# Patient Record
Sex: Male | Born: 1985 | Race: White | Hispanic: No | Marital: Married | Smoking: Current every day smoker
Health system: Southern US, Community
[De-identification: ages and names within clinical notes are randomized; demographics above are authoritative.]

## PROBLEM LIST (undated history)

## (undated) DIAGNOSIS — J45909 Unspecified asthma, uncomplicated: Secondary | ICD-10-CM

---

## 2009-10-03 ENCOUNTER — Encounter: Payer: Self-pay | Admitting: Internal Medicine

## 2009-10-05 ENCOUNTER — Encounter (INDEPENDENT_AMBULATORY_CARE_PROVIDER_SITE_OTHER): Payer: Self-pay | Admitting: *Deleted

## 2009-11-11 ENCOUNTER — Ambulatory Visit: Payer: Self-pay | Admitting: Internal Medicine

## 2009-11-11 DIAGNOSIS — R197 Diarrhea, unspecified: Secondary | ICD-10-CM | POA: Insufficient documentation

## 2009-11-11 DIAGNOSIS — K625 Hemorrhage of anus and rectum: Secondary | ICD-10-CM

## 2009-11-11 DIAGNOSIS — K219 Gastro-esophageal reflux disease without esophagitis: Secondary | ICD-10-CM | POA: Insufficient documentation

## 2009-11-11 LAB — CONVERTED CEMR LAB
ALT: 50 units/L (ref 0–53)
Basophils Absolute: 0 10*3/uL (ref 0.0–0.1)
CO2: 27 meq/L (ref 19–32)
Creatinine, Ser: 1 mg/dL (ref 0.4–1.5)
Eosinophils Absolute: 0.6 10*3/uL (ref 0.0–0.7)
GFR calc non Af Amer: 95.74 mL/min (ref >60)
HCT: 44.1 % (ref 39.0–52.0)
Hemoglobin: 15.4 g/dL (ref 13.0–17.0)
Lymphs Abs: 2.9 10*3/uL (ref 0.7–4.0)
MCHC: 34.9 g/dL (ref 30.0–36.0)
Monocytes Relative: 5.4 % (ref 3.0–12.0)
Neutro Abs: 8 10*3/uL — ABNORMAL HIGH (ref 1.4–7.7)
RDW: 12.8 % (ref 11.5–14.6)
Total Bilirubin: 0.6 mg/dL (ref 0.3–1.2)

## 2009-11-17 ENCOUNTER — Ambulatory Visit: Payer: Self-pay | Admitting: Internal Medicine

## 2009-11-19 ENCOUNTER — Telehealth: Payer: Self-pay | Admitting: Internal Medicine

## 2010-01-01 ENCOUNTER — Ambulatory Visit: Payer: Self-pay | Admitting: Internal Medicine

## 2010-02-16 NOTE — Procedures (Signed)
Summary: Colonoscopy  Patient: Cory Gregory Note: All result statuses are Final unless otherwise noted.  Tests: (1) Colonoscopy (COL)   COL Colonoscopy           DONE     [facility]     [address]     [place], [place]  [address]           COLONOSCOPY PROCEDURE REPORT           PATIENT:  Cory Gregory, Cory Gregory  MR#:  [id number]     BIRTHDATE:  [birth year], 24 yrs. old  GENDER:  male     ENDOSCOPIST:  Iva Boop, MD, [facility]     REF. BY:          Gillis Ends, MD     PROCEDURE DATE:  11/17/2009     PROCEDURE:  Colonoscopy with biopsy     ASA CLASS:  Class I     INDICATIONS:  unexplained diarrhea, rectal bleeding     MEDICATIONS:   Fentanyl 100 mcg IV, Versed 8 mg IV           DESCRIPTION OF PROCEDURE:   After the risks benefits and     alternatives of the procedure were thoroughly explained, informed     consent was obtained.  Digital rectal exam was performed and     revealed no abnormalities and normal prostate.   The LB CF-[id number]     [id number] endoscope was introduced through the anus and advanced to     the terminal ileum which was intubated for a short distance,     without limitations.  The quality of the prep was excellent, using     MoviPrep.  The instrument was then slowly withdrawn as the colon     was fully examined.     <<PROCEDUREIMAGES>>           FINDINGS:  Abnormal appearing mucosa terminal ileum Patchy nodular     changes with red spots. ? inflammation - Crohn's or ? lymphoid     hyperplasia Multiple biopsies were obtained and sent to pathology.     This was otherwise a normal examination of the colon. throughout     the colon. Random biopsies were obtained and sent to pathology.     Retroflexed views in the rectum revealed internal and external     hemorrhoids and hypertrophied anal papillae.    The scope was then     withdrawn from the patient and the procedure completed.           COMPLICATIONS:  None     ENDOSCOPIC IMPRESSION:     1) Abnormal mucosa in  the terminal ileum - nodular changes ?     Crohn's vs. lymphoid hyperplasia. He is not on NSAIDS.     2) Otherwise normal examination throughout the colon - random     biopsies taken to look for colitis.     3) Internal and external hemorrhoids     4) Hypertrophied anal papillae     RECOMMENDATIONS:     1) Await biopsy results     2) He may use loperamide 2 mg (Imodium AD) up to 4 a day to help     diarrhea, if needed/     3) Will call with results and plans.           REPEAT EXAM:  In for Colonoscopy, pending biopsy results.           Maryjean Morn.  Leone Payor, MD, [facility]           CC:  The Patient     Gillis Ends, MD [facility])           n.     Rosalie Doctor:   Iva Boop at 11/17/2009 12:25 PM           Alden Benjamin, [id number]  Note: An exclamation mark (!) indicates a result that was not dispersed into the flowsheet. Document Creation Date: 11/17/2009 12:27 PM _______________________________________________________________________  (1) Order result status: Final Collection or observation date-time: 11/17/2009 12:17 Requested date-time:  Receipt date-time:  Reported date-time:  Referring Physician:   Ordering Physician: Stan Head [phone]) Specimen Source:  Source: Launa Grill Order Number: [id number] Lab site:   Appended Document: Colonoscopy   Colonoscopy  Procedure date:  11/17/2009  Findings:          1) Abnormal mucosa in the terminal ileum - nodular changes ?     Crohn's vs. lymphoid hyperplasia. He is not on NSAIDS. BENIGN MUCOSA, NO INFLAMMATION     2) Otherwise normal examination throughout the colon - random     biopsies taken to look for colitis. BENIGN, NO INFLAMMATION     3) Internal and external hemorrhoids     4) Hypertrophied anal papillae

## 2010-02-16 NOTE — Progress Notes (Signed)
Summary: colonoscopy results:IBS and hemorrhoids  Phone Note Outgoing Call   Summary of Call: Let him know biopsies do not show Crohn's or other colitis and that this seems like IBS - spasms and rapid movement of stools Hemorrhoids cause of bleeding 1) Tyr loperamide every day, try 1 each AM and may increase to 2,  2) Keep hyoscyamine (Levsion) on hand for cramps and urgent need for bathroom 3) Proctocream HC 2.5 % rxed for hemorrhoids 4) Schedule an REV to go over things and adjust Tx in about 6 weeks Iva Boop MD, [facility]  November 19, 2009 8:54 AM   Follow-up for Phone Call        Pt notified of above.  He is agreeable with plan.  He will pick up meds at pharmacy.  Appt is scheduled for 12/16 at 4pm. Follow-up by: Francee Piccolo CMA Duncan Dull),  November 20, 2009 11:29 AM    New/Updated Medications: LOPERAMIDE HCL 2 MG TABS (LOPERAMIDE HCL) 1-2 each AM and as needed up to 4/day for diarrhea PROCTOCREAM HC 2.5 % CREA (HYDROCORTISONE) Apply small amount to anal area and on hemorrhoids nightly x 7 nights then as needed for rectal bleeding Prescriptions: PROCTOCREAM HC 2.5 % CREA (HYDROCORTISONE) Apply small amount to anal area and on hemorrhoids nightly x 7 nights then as needed for rectal bleeding  #30 grams x 0   Entered and Authorized by:   Iva Boop MD, [facility]   Signed by:   Iva Boop MD, FACG on 11/19/2009   Method used:   Electronically to        [facility]. [phone]* (retail)       [address]       [place], [place]  [address]       Ph: [phone] or [phone]       Fax: [phone]   RxID:   [id number]

## 2010-02-16 NOTE — Assessment & Plan Note (Signed)
Summary: IBS...EM   History of Present Illness Visit Type: Initial Consult Primary GI MD: Stan Head MD [facility] Primary Provider: Gillis Ends, MD Requesting Provider: Gillis Ends, MD Chief Complaint: IBS complaints History of Present Illness:   25 yo white man with urgent post-prandial defecation about 90% of the time. Sometimes gets an urge to defecate but unable to. He had problems in high school. He saw an MD, barium enema was negative. He was told it was from stress but he does not think he is anxious except some work stress.  Sees red blood in the toilet with stool 2-3 x a week and some ? of melena a few times. Heartburn for a couple of years helped significantly by PPI. Now with heartburn 1- 2 x a week. He has stopped spicy foods also. Spicy foods will cause defection also. rare nocturnal defecation and cramps. He was well for years after high school until return of problems in August. He bought a house around that time also. Hyoscyamine helped a little with relief for a few hours.  He had seen Dr. Gillis Ends in mid-September and had omeprazole and hyoscyamine prescribed. Notes reviewed.   GI Review of Systems    Reports abdominal pain, acid reflux, belching, bloating, and  chest pain.      Denies dysphagia with liquids, dysphagia with solids, heartburn, loss of appetite, nausea, vomiting, vomiting blood, weight loss, and  weight gain.      Reports black tarry stools, change in bowel habits, constipation, diarrhea, and  rectal bleeding.     Denies anal fissure, diverticulosis, fecal incontinence, heme positive stool, hemorrhoids, irritable bowel syndrome, jaundice, light color stool, liver problems, and  rectal pain. Preventive Screening-Counseling & Management  Alcohol-Tobacco     Alcohol drinks/day: 1     Alcohol type: beer     Smoking Status: current     Smoking Cessation Counseling: yes     Smoke Cessation Stage: precontemplative     Cans of tobacco/week: 0  Passive Smoke Exposure: yes     Tobacco Counseling: not indicated; no tobacco use  Caffeine-Diet-Exercise     Caffeine use/day: 6+     Caffeine Counseling: decrease use of caffeine     Does Patient Exercise: no     Exercise Counseling: not indicated; exercise is adequate  Comments: physical job, weekends only alcohol, few beeers      Drug Use:  no.      Current Medications (verified): 1)  Levsin 0.125 Mg Tabs (Hyoscyamine Sulfate) .... Take 1 Tablet By Mouth Every 6 Hours As Needed  (Been Out X 1 Week) 2)  Omeprazole 20 Mg Cpdr (Omeprazole) .... Take 1 Capsule By Mouth Two Times A Day 3)  Proventil Hfa 108 (90 Base) Mcg/act Aers (Albuterol Sulfate) .... 2 Puffs Every 4-6 Hours  Allergies (verified): 1)  Pcn  Past History:  Past Medical History: Irritable Bowel Syndrome Asthma Hyperlipidemia  Past Surgical History: Head injury surgery  Family History: Reviewed history and no changes required. No FH of Colon Cancer:  Social History: Reviewed history and no changes required. Single No children Patient currently smokes.  1-2 PPD Alcohol Use - yes  on weekends Daily Caffeine Use   5-6 per day Illicit Drug Use - no Smoking Status:  current Drug Use:  no Caffeine use/day:  6+ Cans of tobacco/week:  0 Passive Smoke Exposure:  yes Does Patient Exercise:  no Alcohol drinks/day:  1  Review of Systems       The  patient complains of allergy/sinus.         All other ROS negative except as per HPI.   Vital Signs:  Patient profile:   25 year old male Height:      69.75 inches Weight:      212 pounds BMI:     30.75 Pulse rate:   80 / minute Pulse rhythm:   regular BP sitting:   126 / 82  (left arm)  Vitals Entered By: Milford Cage NCMA (November 11, 2009 3:23 PM)  Physical Exam  General:  healthy appearing.  NAD Eyes:  PERRLA, no icterus. Mouth:  No deformity or lesions, dentition normal. Neck:  Supple; no masses or thyromegaly. Lungs:  Clear throughout to  auscultation. Heart:  Regular rate and rhythm; no murmurs, rubs,  or bruits. Abdomen:  Soft, nontender and nondistended. No masses, hepatosplenomegaly or hernias noted. Normal bowel sounds. Rectal:  mild perianal erythema nontender ANOSCOPY: beefy red rectal mucosa with some flecks of mucous Prostate:  normal Extremities:  No clubbing, cyanosis, edema or deformities noted. Neurologic:  Alert and  oriented x4;  grossly normal neurologically. Cervical Nodes:  No significant cervical or supraclavicular adenopathy.  Psych:  Alert and cooperative. Normal mood and affect.   Impression & Recommendations:  Problem # 1:  DIARRHEA (ICD-787.91) Assessment New Chronic issue. Had problems as a teen and then ok til lately. Seems to coincide with house purchase. I had originally thought IBS and anorectal bleeding likely but proctitis on anoscopy makes me think it is more likely IBD.  Orders: Colonoscopy (Colon) TLB-CBC Platelet - w/Differential (85025-CBCD) TLB-CMP (Comprehensive Metabolic Pnl) (80053-COMP)  Problem # 2:  RECTAL BLEEDING (ICD-569.3) Assessment: New Proctitis on anoscopy explains this, ? more than proctitis.  Orders: Colonoscopy (Colon) TLB-CBC Platelet - w/Differential (85025-CBCD) TLB-CMP (Comprehensive Metabolic Pnl) (80053-COMP)  Problem # 3:  GERD (ICD-530.81) Assessment: New PPI will continue as it helps. He will decrease caffeine also.  Patient Instructions: 1)  Copy sent to : Gillis Ends, MD 2)  GI Reflux brochure given.  3)  Inflammatory Bowel Disease brochure given.  4)  Ulcerative Colitis brochure given.  5)  Crohns brochure given. 6)  Please go to the basement today for your labs.  7)  Your procedure has been scheduled for 11/17/2009, please follow the seperate instructions.  8)  Your prescription(s) have been sent to you pharmacy.  9)  Please continue current medications.  10)  Use Imoddium as needed for diarrhea. 11)  The medication list was reviewed  and reconciled.  All changed / newly prescribed medications were explained.  A complete medication list was provided to the patient / caregiver. Prescriptions: MOVIPREP 100 GM  SOLR (PEG-KCL-NACL-NASULF-NA ASC-C) As per prep instructions.  #1 x 0   Entered by:   Harlow Mares CMA (AAMA)   Authorized by:   Iva Boop MD, [facility]   Signed by:   Harlow Mares CMA (AAMA) on 11/11/2009   Method used:   Electronically to        [facility]. [phone]* (retail)       [address]       [place], [place]  [address]       Ph: [phone] or [phone]       Fax: [phone]   RxID:   [id number]  Patient: Cory Gregory Note: All result statuses are Final unless otherwise noted.  Tests: (1) CBC Platelet w/Diff (CBCD)   White Cell Count     [  H]  12.2 K/uL                   4.5-10.5   Red Cell Count            4.66 Mil/uL                 4.22-5.81   Hemoglobin                15.4 g/dL                   16.1-09.6   Hematocrit                44.1 %                      39.0-52.0   MCV                       94.8 fl                     78.0-100.0   MCHC                      34.9 g/dL                   04.5-40.9   RDW                       12.8 %                      11.5-14.6   Platelet Count            233.0 K/uL                  150.0-400.0   Neutrophil %              65.5 %                      43.0-77.0   Lymphocyte %              23.8 %                      12.0-46.0   Monocyte %                5.4 %                       3.0-12.0   Eosinophils%              5.0 %                       0.0-5.0   Basophils %               0.3 %                       0.0-3.0   Neutrophill Absolute [H]  8.0 K/uL                    1.4-7.7   Lymphocyte Absolute       2.9 K/uL                    0.7-4.0   Monocyte Absolute         0.7 K/uL  0.1-1.0  Eosinophils, Absolute                             0.6 K/uL                    0.0-0.7   Basophils Absolute         0.0 K/uL                    0.0-0.1  Tests: (2) CMP (COMP)   Sodium                    137 mEq/L                   135-145   Potassium                 4.8 mEq/L                   3.5-5.1   Chloride                  100 mEq/L                   96-112   Carbon Dioxide            27 mEq/L                    19-32   Glucose                   82 mg/dL                    93-81   BUN                       10 mg/dL                    8-29   Creatinine                1.0 mg/dL                   9.3-7.1   Total Bilirubin           0.6 mg/dL                   6.9-6.7   Alkaline Phosphatase      77 U/L                      39-117   AST                  [H]  39 U/L                      0-37   ALT                       50 U/L                      0-53   Total Protein             7.2 g/dL                    8.9-3.8   Albumin  4.3 g/dL                    5.7-8.4   Calcium                   9.9 mg/dL                   6.9-62.9   GFR                       95.74 mL/min                >60

## 2010-02-16 NOTE — Letter (Signed)
Summary: Out of Work  [facility]  [address] [place], [place] [address]   Phone: [phone]  Fax: [phone]    11/11/2009  TO: Leodis Sias IT MAY CONCERN  RE: Cory Gregory [address] [id number]       The above named individual is currently under my care and will be out of work    FROM: 11/11/2009   THROUGH:11/11/2009    REASON: Doctor Appt.    MAY RETURN ON:11/12/2009     If you have any further questions or need additional information, please call.     Sincerely,   Stan Head, MD typed by: Harlow Mares CMA (AAMA)

## 2010-02-16 NOTE — Letter (Signed)
Summary: Out of Work  [facility]  [address] [place], [place] [address]   Phone: [phone]  Fax: [phone]    11/17/2009  TO: Leodis Sias IT MAY CONCERN  RE: Cory Gregory [address] [id number]       The above named individual is currently under my care and will be out of work    FROM: 11/17/2009   THROUGH:    REASON:    MAY RETURN ON:     If you have any further questions or need additional information, please call.     Sincerely,    typed by: Clide Cliff RN

## 2010-02-16 NOTE — Letter (Signed)
Summary: [facility]  [facility]   Imported By: Lester [place] 11/16/2009 09:03:20  _____________________________________________________________________  External Attachment:    Type:   Image     Comment:   External Document

## 2010-02-16 NOTE — Letter (Signed)
Summary: New Patient letter  [facility]  [address] [place], [place] [address]   Phone: [phone]  Fax: [phone]       10/05/2009 MRN: [id number]  Cory Gregory [address] [place], [place]  [address]  Dear Mr. LOTTMAN,  Welcome to the Gastroenterology Division at [facility]    You are scheduled to see Dr.  Stan Head on November 11, 2009 at 3:00pm on the 3rd floor at [facility], [address]  We ask that you try to arrive at our office 15 minutes prior to your appointment time to allow for check-in.  We would like you to complete the enclosed self-administered evaluation form prior to your visit and bring it with you on the day of your appointment.  We will review it with you.  Also, please bring a complete list of all your medications or, if you prefer, bring the medication bottles and we will list them.  Please bring your insurance card so that we may make a copy of it.  If your insurance requires a referral to see a specialist, please bring your referral form from your primary care physician.  Co-payments are due at the time of your visit and may be paid by cash, check or credit card.     Your office visit will consist of a consult with your physician (includes a physical exam), any laboratory testing he/she may order, scheduling of any necessary diagnostic testing (e.g. x-ray, ultrasound, CT-scan), and scheduling of a procedure (e.g. Endoscopy, Colonoscopy) if required.  Please allow enough time on your schedule to allow for any/all of these possibilities.    If you cannot keep your appointment, please call [phone] to cancel or reschedule prior to your appointment date.  This allows [place] the opportunity to schedule an appointment for another patient in need of care.  If you do not cancel or reschedule by 5 p.m. the business day prior to your appointment date, you will be charged a $50.00 late cancellation/no-show fee.    Thank you for  choosing [facility] for your medical needs.  We appreciate the opportunity to care for you.  Please visit [place] at our website  to learn more about our practice.                     Sincerely,                                                             The Gastroenterology Division

## 2010-02-16 NOTE — Letter (Signed)
Summary: [facility] Instructions  [facility]  [address] [place], [place] [address]   Phone: [phone]  Fax: [phone]       Cory Gregory    Feb 23, 1985    MRN: [id number]        Procedure Day /Date: 11/17/2009 Tuesday       Arrival Time: 10:30am     Procedure Time: 11:30am     Location of Procedure:                    X  [facility] (4th Floor)   PREPARATION FOR COLONOSCOPY WITH MOVIPREP   Starting 5 days prior to your procedure 11/12/2009 do not eat nuts, seeds, popcorn, corn, beans, peas,  salads, or any raw vegetables.  Do not take any fiber supplements (e.g. Metamucil, Citrucel, and Benefiber).  THE DAY BEFORE YOUR PROCEDURE         DATE: 11/16/2009  DAY: Monday  1.  Drink clear liquids the entire day-NO SOLID FOOD  2.  Do not drink anything colored red or purple.  Avoid juices with pulp.  No orange juice.  3.  Drink at least 64 oz. (8 glasses) of fluid/clear liquids during the day to prevent dehydration and help the prep work efficiently.  CLEAR LIQUIDS INCLUDE: Water Jello Ice Popsicles Tea (sugar ok, no milk/cream) Powdered fruit flavored drinks Coffee (sugar ok, no milk/cream) Gatorade Juice: apple, white grape, white cranberry  Lemonade Clear bullion, consomm, broth Carbonated beverages (any kind) Strained chicken noodle soup Hard Candy                             4.  In the morning, mix first dose of MoviPrep solution:    Empty 1 Pouch A and 1 Pouch B into the disposable container    Add lukewarm drinking water to the top line of the container. Mix to dissolve    Refrigerate (mixed solution should be used within 24 hrs)  5.  Begin drinking the prep at 5:00 p.m. The MoviPrep container is divided by 4 marks.   Every 15 minutes drink the solution down to the next mark (approximately 8 oz) until the full liter is complete.   6.  Follow completed prep with 16 oz of clear liquid of your choice (Nothing red or purple).   Continue to drink clear liquids until bedtime.  7.  Before going to bed, mix second dose of MoviPrep solution:    Empty 1 Pouch A and 1 Pouch B into the disposable container    Add lukewarm drinking water to the top line of the container. Mix to dissolve    Refrigerate  THE DAY OF YOUR PROCEDURE      DATE: 11/17/2009  DAY: Tuesday  Beginning at 6:30am (5 hours before procedure):         1. Every 15 minutes, drink the solution down to the next mark (approx 8 oz) until the full liter is complete.  2. Follow completed prep with 16 oz. of clear liquid of your choice.    3. You may drink clear liquids until 9:30am (2 HOURS BEFORE PROCEDURE).   MEDICATION INSTRUCTIONS  Unless otherwise instructed, you should take regular prescription medications with a small sip of water   as early as possible the morning of your procedure.          OTHER INSTRUCTIONS  You will need a responsible adult at least 27  years of age to accompany you and drive you home.   This person must remain in the waiting room during your procedure.  Wear loose fitting clothing that is easily removed.  Leave jewelry and other valuables at home.  However, you may wish to bring a book to read or  an iPod/MP3 player to listen to music as you wait for your procedure to start.  Remove all body piercing jewelry and leave at home.  Total time from sign-in until discharge is approximately 2-3 hours.  You should go home directly after your procedure and rest.  You can resume normal activities the  day after your procedure.  The day of your procedure you should not:   Drive   Make legal decisions   Operate machinery   Drink alcohol   Return to work  You will receive specific instructions about eating, activities and medications before you leave.    The above instructions have been reviewed and explained to me by   _______________________    I fully understand and can verbalize these instructions  _____________________________ Date _________

## 2014-06-18 ENCOUNTER — Emergency Department (HOSPITAL_COMMUNITY): Payer: BLUE CROSS/BLUE SHIELD

## 2014-06-18 ENCOUNTER — Encounter (HOSPITAL_COMMUNITY): Payer: Self-pay | Admitting: *Deleted

## 2014-06-18 ENCOUNTER — Emergency Department (HOSPITAL_COMMUNITY)
Admission: EM | Admit: 2014-06-18 | Discharge: 2014-06-18 | Disposition: A | Discharge status: Home / Self Care | Payer: BLUE CROSS/BLUE SHIELD | Attending: Emergency Medicine | Admitting: Emergency Medicine

## 2014-06-18 DIAGNOSIS — Z792 Long term (current) use of antibiotics: Secondary | ICD-10-CM | POA: Diagnosis not present

## 2014-06-18 DIAGNOSIS — Z880 Allergy status to penicillin: Secondary | ICD-10-CM | POA: Diagnosis not present

## 2014-06-18 DIAGNOSIS — J45909 Unspecified asthma, uncomplicated: Secondary | ICD-10-CM | POA: Diagnosis not present

## 2014-06-18 DIAGNOSIS — Z720 Tobacco use: Secondary | ICD-10-CM | POA: Diagnosis not present

## 2014-06-18 DIAGNOSIS — N410 Acute prostatitis: Secondary | ICD-10-CM

## 2014-06-18 DIAGNOSIS — R1030 Lower abdominal pain, unspecified: Secondary | ICD-10-CM | POA: Diagnosis present

## 2014-06-18 DIAGNOSIS — R109 Unspecified abdominal pain: Secondary | ICD-10-CM

## 2014-06-18 DIAGNOSIS — R162 Hepatomegaly with splenomegaly, not elsewhere classified: Secondary | ICD-10-CM | POA: Insufficient documentation

## 2014-06-18 HISTORY — DX: Unspecified asthma, uncomplicated: J45.909

## 2014-06-18 LAB — URINE MICROSCOPIC-ADD ON

## 2014-06-18 LAB — URINALYSIS, ROUTINE W REFLEX MICROSCOPIC
Glucose, UA: NEGATIVE mg/dL
HGB URINE DIPSTICK: NEGATIVE
Leukocytes, UA: NEGATIVE
NITRITE: NEGATIVE
Protein, ur: 30 mg/dL — AB
SPECIFIC GRAVITY, URINE: 1.031 — AB (ref 1.005–1.030)
UROBILINOGEN UA: 0.2 mg/dL (ref 0.0–1.0)
pH: 5.5 (ref 5.0–8.0)

## 2014-06-18 MED ORDER — OXYCODONE HCL 5 MG PO TABS: 5.0000 mg | ORAL_TABLET | ORAL | Status: AC | PRN

## 2014-06-18 MED ORDER — OXYCODONE-ACETAMINOPHEN 5-325 MG PO TABS
2.0000 | ORAL_TABLET | Freq: Once | ORAL | Status: AC
  Administered 2014-06-18: 2 via ORAL
  Filled 2014-06-18: qty 2

## 2014-06-18 MED ORDER — ONDANSETRON 4 MG PO TBDP: ORAL_TABLET | ORAL | Status: AC

## 2014-06-18 MED ORDER — ONDANSETRON 4 MG PO TBDP
4.0000 mg | ORAL_TABLET | Freq: Once | ORAL | Status: AC
  Administered 2014-06-18: 4 mg via ORAL
  Filled 2014-06-18: qty 1

## 2014-06-18 NOTE — ED Notes (Addendum)
Pt states that he has left flank pain and decreased urination. Pt went to pcp yesterday and was told that he had a bacterial infection in his prostate. Pt reports receiving a shot and antibiotics. Pt also reports lower abdominal pain.

## 2014-06-18 NOTE — Discharge Instructions (Signed)
Take Roxicodone as directed as needed for severe pain. No driving or operating heavy machinery while taking this drug as it may cause drowsiness. Take Zofran as directed as needed for nausea. Follow-up with both gastroenterology and your primary care physician within one week.  Hepatomegaly Hepatomegaly means the liver is larger than normal (enlarged). Some health problems can cause the liver to get bigger. Some people have an enlarged liver but do not know it.  CAUSES Possible causes of hepatomegaly include:  Liver disease, such as:  Cirrhosis. This is long-term (chronic) liver damage often caused by drinking too much alcohol. Cirrhosis may also be caused by other liver problems.  Hepatitis. This is an infection of the liver.  Fatty liver disease.  Disorders that cause things to accumulate in the liver (Wilson's disease, amyloidosis, hemochromatosis).  Cancer. The disease may start in the liver, or cancer may start somewhere else in the body and spread to the liver.  Heart or blood vessel disease. These can cause hepatomegaly if blood backs up into the liver. SYMPTOMS  Some people have no symptoms. If symptoms are present, they may include:  Abdominal pain on the right side.  Fatigue.  Loss of appetite.  Nausea.  Vomiting.  Yellowing of the skin and whites of the eyes (jaundice). DIAGNOSIS  To decide if your liver is enlarged, a caregiver will ask about your history and perform a physical exam. The caregiver may press on the right side of your abdomen to feel your liver. This is a way to check if the edge of your liver sticks out below your rib cage. Your caregiver may also order some tests that include:  Blood tests. These tests check whether your liver is working like it should be. They also check for infection.  Imaging tests. These are tests that take pictures of your liver. They may include:  A computed tomography (CT) scan. This is an X-ray guided by a  computer.  Magnetic resonance imaging (MRI). This test creates pictures by using magnets and a computer.  An ultrasound. Images are made using sound waves.  Liver biopsy. A small sample of liver tissue is taken out and examined under a microscope. TREATMENT  Treatment of hepatomegaly depends on what is causing it. HOME CARE INSTRUCTIONS What you need to do at home depends on the cause of your hepatomegaly. In general:  Take all medicine as directed by your caregiver. Follow the directions carefully. Do not start taking any new medicine unless your caregiver says it is okay. This includes over-the-counter medicines, supplements, and herbal remedies. Some of these can hurt your liver.  Stay at a healthy weight.  Follow a healthy diet. Eat lots of fruits, vegetables, and whole grains.  Do not drink alcohol.  Do not smoke.  Keep all follow-up appointments to make sure your treatment is working and your liver stays healthy. SEEK MEDICAL CARE IF:  You have increased or localized abdominal pain.  You have persistent vomiting. SEEK IMMEDIATE MEDICAL CARE IF:   You vomit bright red blood or blood that looks like coffee grounds.  You have chest pain.  You have trouble breathing. MAKE SURE YOU:  Understand these instructions.  Will watch your condition.  Will get help right away if you are not doing well or get worse. Document Released: 03/28/2011 Document Reviewed: 03/28/2011 [facility] Patient Information 2015 [place]. This information is not intended to replace advice given to you by your health care provider. Make sure you discuss any questions you  have with your health care provider.  Prostatitis The prostate gland is about the size and shape of a walnut. It is located just below your bladder. It produces one of the components of semen, which is made up of sperm and the fluids that help nourish and transport it out from the testicles. Prostatitis is inflammation of the  prostate gland.  There are four types of prostatitis:  Acute bacterial prostatitis. This is the least common type of prostatitis. It starts quickly and usually is associated with a bladder infection, high fever, and shaking chills. It can occur at any age.  Chronic bacterial prostatitis. This is a persistent bacterial infection in the prostate. It usually develops from repeated acute bacterial prostatitis or acute bacterial prostatitis that was not properly treated. It can occur in men of any age but is most common in middle-aged men whose prostate has begun to enlarge. The symptoms are not as severe as those in acute bacterial prostatitis. Discomfort in the part of your body that is in front of your rectum and below your scrotum (perineum), lower abdomen, or in the head of your penis (glans) may represent your primary discomfort.  Chronic prostatitis (nonbacterial). This is the most common type of prostatitis. It is inflammation of the prostate gland that is not caused by a bacterial infection. The cause is unknown and may be associated with a viral infection or autoimmune disorder.  Prostatodynia (pelvic floor disorder). This is associated with increased muscular tone in the pelvis surrounding the prostate. CAUSES The causes of bacterial prostatitis are bacterial infection. The causes of the other types of prostatitis are unknown.  SYMPTOMS  Symptoms can vary depending upon the type of prostatitis that exists. There can also be overlap in symptoms. Possible symptoms for each type of prostatitis are listed below. Acute Bacterial Prostatitis  Painful urination.  Fever or chills.  Muscle or joint pains.  Low back pain.  Low abdominal pain.  Inability to empty bladder completely. Chronic Bacterial Prostatitis, Chronic Nonbacterial Prostatitis, and Prostatodynia  Sudden urge to urinate.  Frequent urination.  Difficulty starting urine stream.  Weak urine stream.  Discharge from the  urethra.  Dribbling after urination.  Rectal pain.  Pain in the testicles, penis, or tip of the penis.  Pain in the perineum.  Problems with sexual function.  Painful ejaculation.  Bloody semen. DIAGNOSIS  In order to diagnose prostatitis, your health care provider will ask about your symptoms. One or more urine samples will be taken and tested (urinalysis). If the urinalysis result is negative for bacteria, your health care provider may use a finger to feel your prostate (digital rectal exam). This exam helps your health care provider determine if your prostate is swollen and tender. It will also produce a specimen of semen that can be analyzed. TREATMENT  Treatment for prostatitis depends on the cause. If a bacterial infection is the cause, it can be treated with antibiotic medicine. In cases of chronic bacterial prostatitis, the use of antibiotics for up to 1 month or 6 weeks may be necessary. Your health care provider may instruct you to take sitz baths to help relieve pain. A sitz bath is a bath of hot water in which your hips and buttocks are under water. This relaxes the pelvic floor muscles and often helps to relieve the pressure on your prostate. HOME CARE INSTRUCTIONS   Take all medicines as directed by your health care provider.  Take sitz baths as directed by your health care provider.  SEEK MEDICAL CARE IF:   Your symptoms get worse, not better.  You have a fever. SEEK IMMEDIATE MEDICAL CARE IF:   You have chills.  You feel nauseous or vomit.  You feel lightheaded or faint.  You are unable to urinate.  You have blood or blood clots in your urine. MAKE SURE YOU:  Understand these instructions.  Will watch your condition.  Will get help right away if you are not doing well or get worse. Document Released: 01/01/2000 Document Revised: 01/08/2013 Document Reviewed: 07/23/2012 [facility] Patient Information 2015 [place], [place]. This information is not intended to  replace advice given to you by your health care provider. Make sure you discuss any questions you have with your health care provider.

## 2014-06-18 NOTE — ED Provider Notes (Signed)
CSN: [id number]     Arrival date & time 06/18/14  1556 History  This chart was scribed for non-physician practitioner, Celene Skeen, working with Gray Bernhardt, MD by Richarda Overlie, ED Scribe. This patient was seen in room TR03C/TR03C and the patient's care was started at 4:25 PM.  Chief Complaint  Patient presents with  . Flank Pain   The history is provided by the patient. No language interpreter was used.   HPI Comments: Cory Gregory is a 29 y.o. male who presents to the Emergency Department complaining of lower abdominal pain for the last 4 days. He rates his pain as a 8/10 at this time, no aggravating or alleviating factors. Pt also complains of bilateral flank pain and reports a fever with a maximum temperature of 101 earlier today, has been taking tylenol for fever. He states that he went to his PCP yesterday and was dx with prostatitis. Pt states that it hurt when his PCP checked his prostate. Pt states that he received a shot and a Cipro prescription yesterday from his PCP. He reports he has had trouble with urination and states he has only urinated approximately 3oz in the last 4 days. Pt states that he thinks he has been drinking water normally but says he experiences mild nausea with oral intake and vomited water yesterday once. Pt reports he is allergic to penicillins.  Past Medical History  Diagnosis Date  . Asthma    History reviewed. No pertinent past surgical history. No family history on file. History  Substance Use Topics  . Smoking status: Current Every Day Smoker -- 1.00 packs/day    Types: Cigarettes  . Smokeless tobacco: Not on file  . Alcohol Use: Not on file    Review of Systems  Constitutional: Positive for fever.  Gastrointestinal: Positive for nausea, vomiting and abdominal pain.  Genitourinary: Positive for flank pain and difficulty urinating.  All other systems reviewed and are negative.  Allergies  Penicillins  Home Medications   Prior to Admission  medications   Medication Sig Start Date End Date Taking? Authorizing Provider  ciprofloxacin (CIPRO) 500 MG tablet Take 500 mg by mouth 2 (two) times daily. Started medication on 06-17-14   Yes Historical Provider, MD  ondansetron (ZOFRAN ODT) 4 MG disintegrating tablet  ODT q4 hours prn nausea/vomit 06/18/14   Edy Mcbane M Aleenah Homen, PA-C  oxyCODONE (ROXICODONE) 5 MG immediate release tablet Take 1 tablet (5 mg total) by mouth every 4 (four) hours as needed for severe pain. 06/18/14   Michaella Imai M Giovannie Scerbo, PA-C   BP 113/63 mmHg  Pulse 93  Temp(Src) 98.6 F (37 C) (Oral)  Resp 16  Ht  (1.778 m)  Wt 236 lb (107.049 kg)  BMI 33.86 kg/m2  SpO2 95% Physical Exam  Constitutional: He is oriented to person, place, and time. He appears well-developed and well-nourished. No distress.  HENT:  Head: Normocephalic and atraumatic.  Eyes: Conjunctivae and EOM are normal.  Neck: Normal range of motion. Neck supple.  Cardiovascular: Normal rate, regular rhythm and normal heart sounds.   Pulmonary/Chest: Effort normal and breath sounds normal.  Abdominal: Normal appearance and bowel sounds are normal. He exhibits no distension.  Generalized abdominal tenderness. Worse suprapubic. No CVAT. No peritoneal signs.  Genitourinary:  Rectal exam deferred per pt.  Musculoskeletal: Normal range of motion. He exhibits no edema.  Neurological: He is alert and oriented to person, place, and time.  Skin: Skin is warm and dry.  Psychiatric: He has a normal mood  and affect. His behavior is normal.  Nursing note and vitals reviewed.   ED Course  Procedures  DIAGNOSTIC STUDIES: Oxygen Saturation is 94% on RA, normal by my interpretation.    COORDINATION OF CARE: 4:30 PM Discussed treatment plan with pt at bedside and pt agreed to plan.  Labs Review Labs Reviewed  URINALYSIS, ROUTINE W REFLEX MICROSCOPIC (NOT [facility]) - Abnormal; Notable for the following:    Color, Urine AMBER (*)    Specific Gravity, Urine 1.031 (*)     Bilirubin Urine SMALL (*)    Ketones, ur >80 (*)    Protein, ur 30 (*)    All other components within normal limits  URINE CULTURE  URINE MICROSCOPIC-ADD ON    Imaging Review Ct Abdomen Pelvis Wo Contrast  06/18/2014   CLINICAL DATA:  LEFT flank pain. Frequency and fever with decreased urination.  EXAM: CT ABDOMEN AND PELVIS WITHOUT CONTRAST  TECHNIQUE: Multidetector CT imaging of the abdomen and pelvis was performed following the standard protocol without IV contrast.  COMPARISON:  None.  FINDINGS: Musculoskeletal:  No aggressive osseous lesions.  Lung Bases: Dependent atelectasis.  Liver: Hepatomegaly with 20.5 cm liver span and hepatosteatosis. Focal fatty sparing adjacent to the gallbladder fossa.  Spleen:  Mild splenomegaly with 13.5 cm splenic span.  Gallbladder:  No calcified stones.  Common bile duct:  Normal.  Pancreas:  Normal.  Adrenal glands:  Normal bilaterally.  Kidneys: No hydronephrosis. Both ureters appear normal. Punctate RIGHT inferior pole renal collecting system calculus (image 44 series 2).  Stomach:  Collapsed.  Small bowel:  Grossly normal.  Colon:   Normal appendix.  No inflammatory changes of colon.  Pelvic Genitourinary: Urinary bladder is collapsed. The prostate gland appears normal.  Peritoneum: Tiny amount of free fluid is present in the anatomic pelvis without cause identified. This is abnormal in a male.  Vascular/lymphatic: Mild aortoiliac atherosclerotic calcification, age advanced.  Body Wall: Normal.  IMPRESSION: 1. Tiny amount of free fluid in the anatomic pelvis without cause identified. This is a nonspecific finding but is abnormal in a male. 2. Hepatosteatosis and hepatosplenomegaly. 3. Punctate nonobstructing RIGHT inferior pole renal collecting system calculus. 4. 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 Office based assessment of atherosclerotic risk factors is recommended.   Electronically Signed   By: Andreas NewportGeoffrey  Lamke M.D.   On: 06/18/2014 18:33     EKG  Interpretation None      MDM   Final diagnoses:  Acute prostatitis  Hepatosplenomegaly   Nontoxic appearing, NAD. AF VSS. Abdomen is soft with no peritoneal signs. Generalized tenderness, worse suprapubic. Diagnosed with prostatitis yesterday and treated, currently on Cipro. Pain is continuing. Having difficulty urinating. Difficulty urinating is most likely from the prostatitis, however cannot rule out obstructing stone. CT obtained for further evaluation. CT with results as stated above. I discussed on the phone with the radiologist regarding the free fluid. He states this can be from any inflammatory process, and given recent diagnosis of prostatitis, this is possible. Regarding hepatosplenomegaly, patient reports he was a prior heavy drinker. He did not know this diagnosis. He has no significant tenderness over his liver or spleen. I advised him to follow-up with his PCP or GI regarding this. Will discharge home with pain and nausea medication. Advised him to continue the Cipro. Tolerating PO. Stable for discharge. Return precautions given. Patient states understanding of treatment care plan and is agreeable.  I personally performed the services described in this documentation, which was scribed in my presence. The  recorded information has been reviewed and is accurate.  Kathrynn Speed, PA-C 06/18/14 1921  Mancel Bale, MD 06/19/14 [id number]

## 2014-06-19 LAB — URINE CULTURE
CULTURE: NO GROWTH
Colony Count: NO GROWTH

## 2014-06-25 ENCOUNTER — Other Ambulatory Visit (HOSPITAL_COMMUNITY): Payer: Self-pay | Admitting: Physician Assistant

## 2014-06-25 DIAGNOSIS — R161 Splenomegaly, not elsewhere classified: Secondary | ICD-10-CM

## 2014-06-25 DIAGNOSIS — R160 Hepatomegaly, not elsewhere classified: Secondary | ICD-10-CM

## 2014-06-25 DIAGNOSIS — R7989 Other specified abnormal findings of blood chemistry: Secondary | ICD-10-CM

## 2014-06-25 DIAGNOSIS — R945 Abnormal results of liver function studies: Secondary | ICD-10-CM

## 2014-07-09 ENCOUNTER — Telehealth (HOSPITAL_COMMUNITY): Payer: Self-pay

## 2014-07-09 NOTE — Telephone Encounter (Signed)
Called to remind pt of 7am appt in ultrasound at [facility]. Pt agreed to arrive at 645 and stay npo for exam. AW

## 2014-07-10 ENCOUNTER — Ambulatory Visit (HOSPITAL_COMMUNITY)
Admission: RE | Admit: 2014-07-10 | Discharge: 2014-07-10 | Disposition: A | Discharge status: Home / Self Care | Payer: BLUE CROSS/BLUE SHIELD | Source: Ambulatory Visit | Attending: Physician Assistant | Admitting: Physician Assistant

## 2014-07-10 DIAGNOSIS — K760 Fatty (change of) liver, not elsewhere classified: Secondary | ICD-10-CM | POA: Diagnosis not present

## 2014-07-10 DIAGNOSIS — R161 Splenomegaly, not elsewhere classified: Secondary | ICD-10-CM

## 2014-07-10 DIAGNOSIS — R945 Abnormal results of liver function studies: Secondary | ICD-10-CM

## 2014-07-10 DIAGNOSIS — R7989 Other specified abnormal findings of blood chemistry: Secondary | ICD-10-CM

## 2014-07-10 DIAGNOSIS — R160 Hepatomegaly, not elsewhere classified: Secondary | ICD-10-CM

## 2014-07-10 DIAGNOSIS — R162 Hepatomegaly with splenomegaly, not elsewhere classified: Secondary | ICD-10-CM | POA: Diagnosis present

## 2015-10-26 IMAGING — US US ABDOMEN COMPLETE W/ ELASTOGRAPHY
1 series · 13 of 25 positions shown · non-contrast
Comparison: None.

CLINICAL DATA: Hepatosplenomegaly.  Elevated LFTs.



[Series 1: us abdomen complete w/ elastography · 0.18mm/px · 13 of 27 slices shown]
[im 1/27]
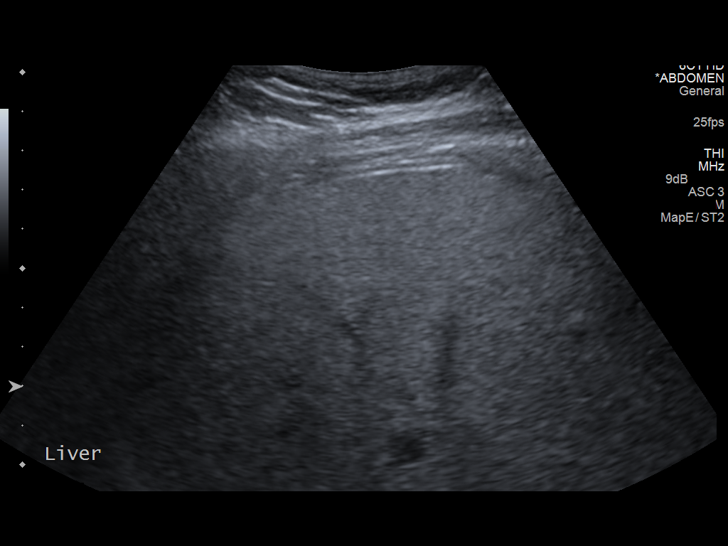
[im 3/27]
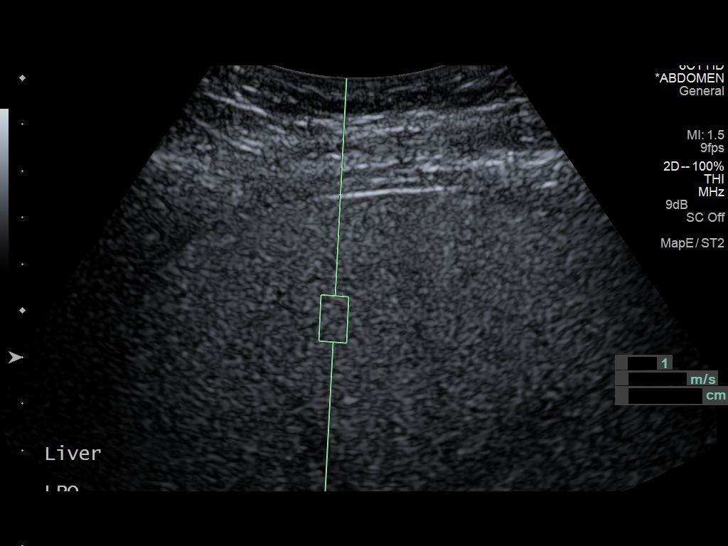
[im 5/27]
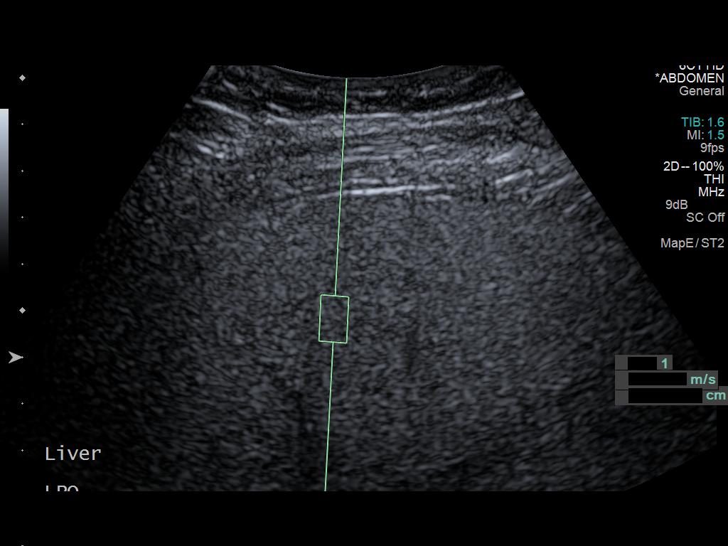
[im 7/27]
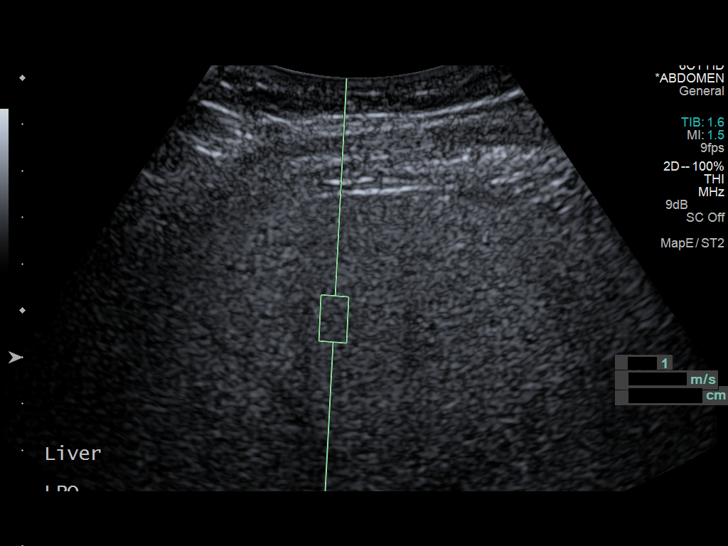
[im 9/27]
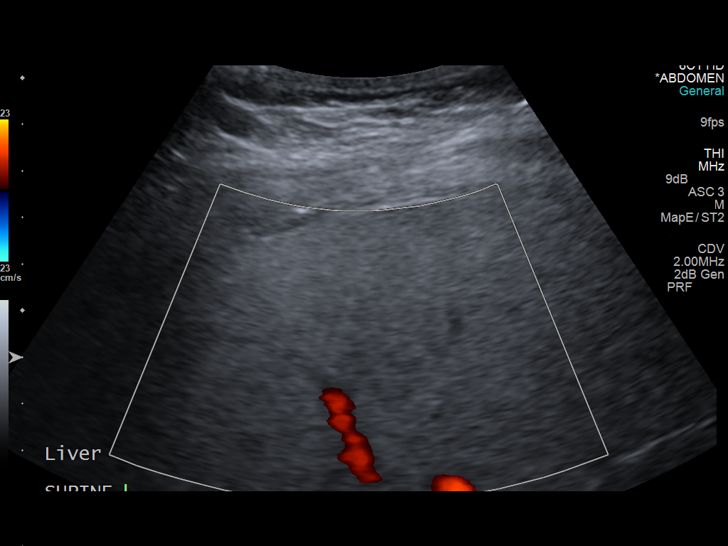
[im 11/27]
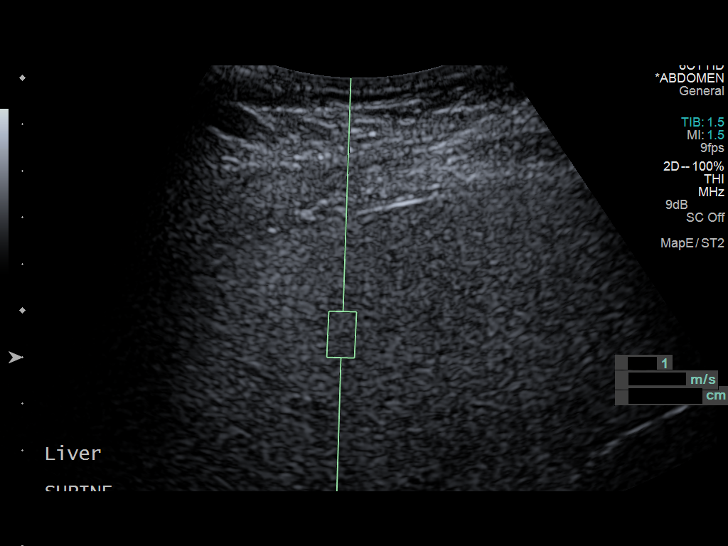
[im 14/27]
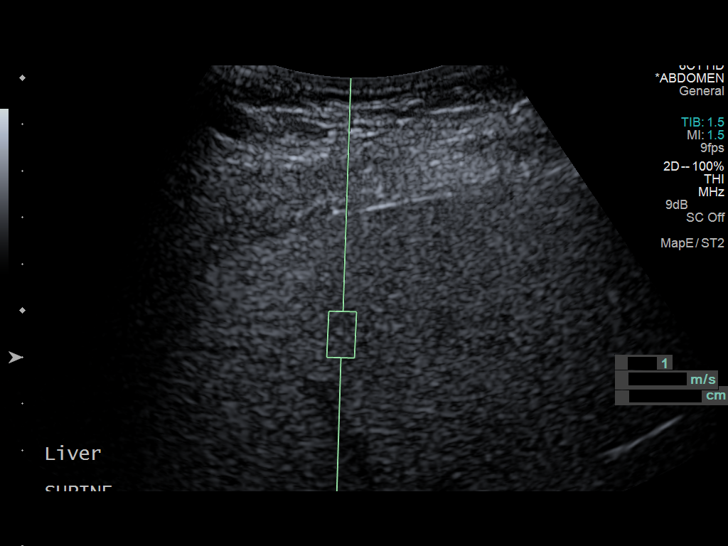
[im 16/27]
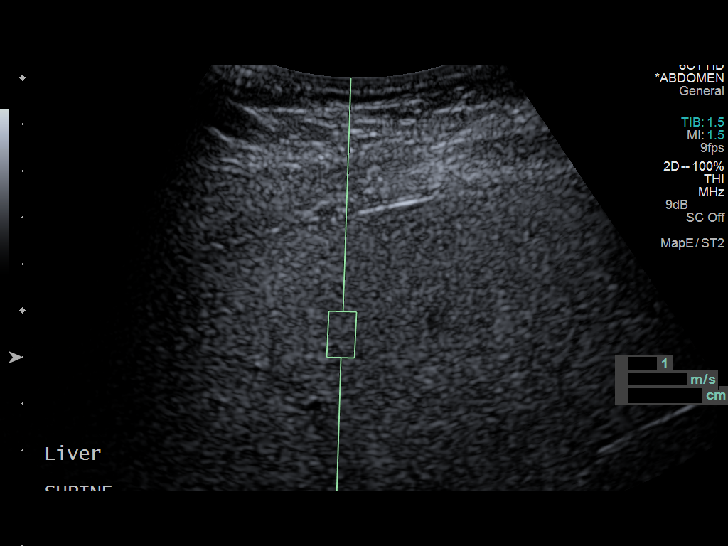
[im 18/27]
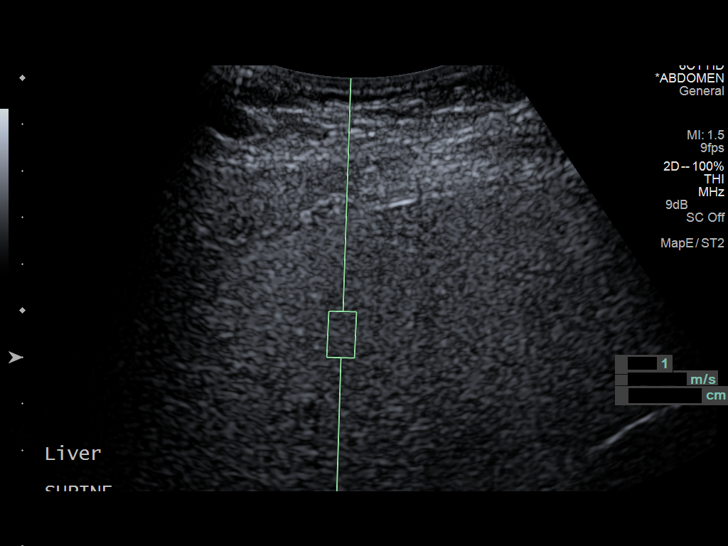
[im 20/27]
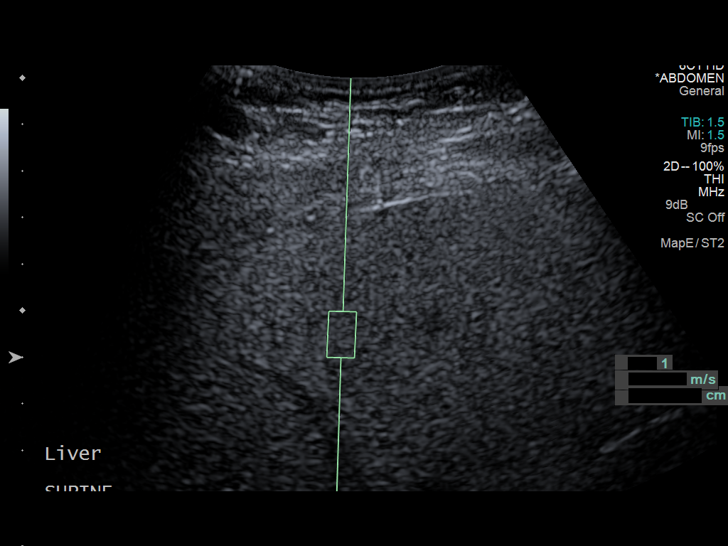
[im 22/27]
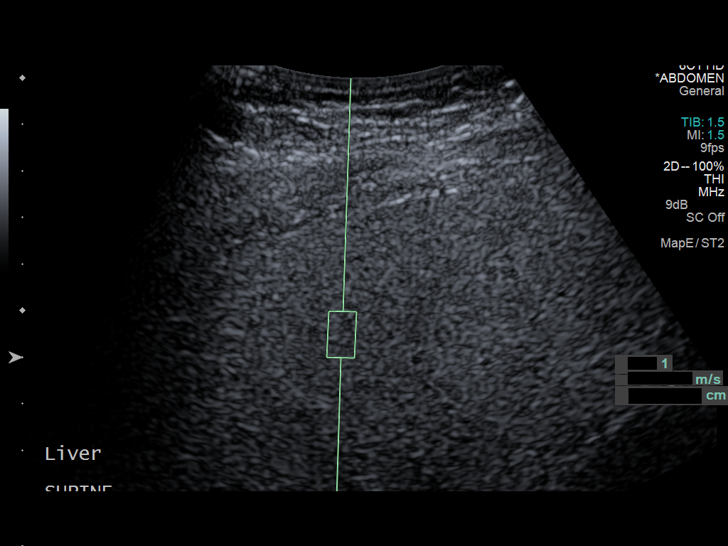
[im 24/27]
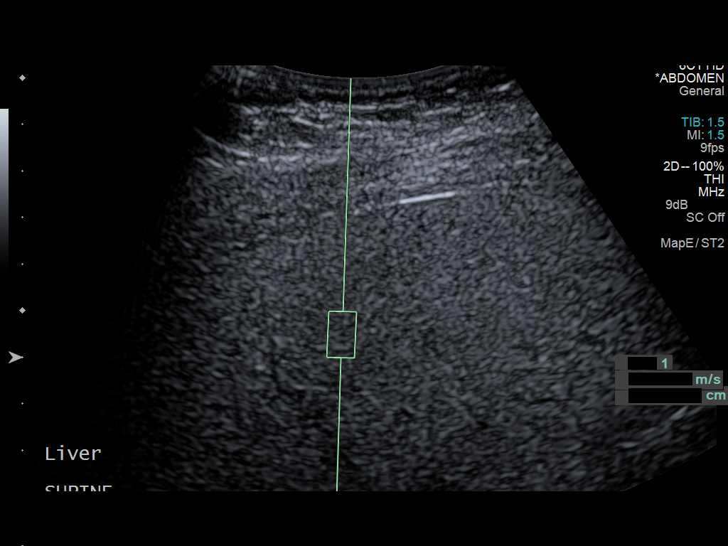
[im 27/27]
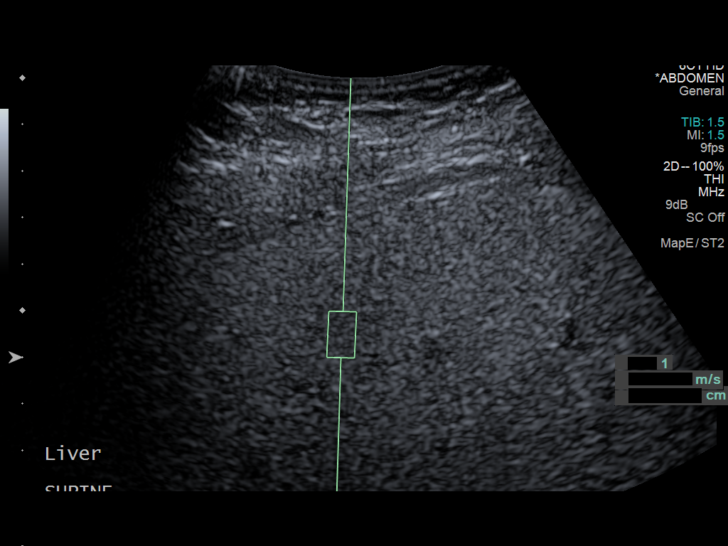

[13 of 25 positions shown; findings below may reference images not displayed]

FINDINGS: ULTRASOUND ABDOMEN

Gallbladder: No gallstones or wall thickening visualized. No
sonographic Murphy sign noted.

Common bile duct: Diameter: 6.4 mm

Liver: Diffusely echogenic compatible with hepatic steatosis. No
focal liver abnormality.

IVC: No abnormality visualized.

Pancreas: Visualized portion unremarkable.

Spleen: Size and appearance within normal limits. Measures 9.4 cm in
length.

Right Kidney: Length: 12.5 cm. Echogenicity within normal limits. No
mass or hydronephrosis visualized.

Left Kidney: Length: 11.2 cm. Echogenicity within normal limits. No
mass or hydronephrosis visualized.

Abdominal aorta: No aneurysm visualized.

Other findings: None.

ULTRASOUND HEPATIC ELASTOGRAPHY

Device: Siemens Helix VTQ

Transducer 6 C1

Patient position: Supine

Number of measurements:  The 10

Hepatic Segment:  8

Median velocity:   2.66  m/sec

IQR:

IQR/Median velocity ratio

Corresponding Metavir fibrosis score:  F 3 and F 4.

Risk of fibrosis: High

Limitations of exam: None

Pertinent findings noted on other imaging exams: Hepatic steatosis
noted.

Please note that abnormal shear wave velocities may also be
identified in clinical settings other than with hepatic fibrosis,
such as: acute hepatitis, elevated right heart and central venous
pressures including use of beta blockers, Aldo Raul disease
([place]), infiltrative processes such as
mastocytosis/amyloidosis/infiltrative tumor, extrahepatic
cholestasis, in the post-prandial state, and liver transplantation.
Correlation with patient history, laboratory data, and clinical
condition recommended.
IMPRESSION: 1. Hepatic steatosis.
2. Normal size spleen.

Median hepatic shear wave velocity is calculated at 2.66 m/sec.

Corresponding Metavir fibrosis score is F 3 and F 4.

Risk of fibrosis is high.

Follow-up:  Followup is advised.

## 2018-11-19 DIAGNOSIS — Z20828 Contact with and (suspected) exposure to other viral communicable diseases: Secondary | ICD-10-CM | POA: Diagnosis not present

## 2019-03-07 DIAGNOSIS — Z20822 Contact with and (suspected) exposure to covid-19: Secondary | ICD-10-CM | POA: Diagnosis not present

## 2019-03-07 DIAGNOSIS — R05 Cough: Secondary | ICD-10-CM | POA: Diagnosis not present

## 2019-03-07 DIAGNOSIS — J019 Acute sinusitis, unspecified: Secondary | ICD-10-CM | POA: Diagnosis not present

## 2019-03-07 DIAGNOSIS — J069 Acute upper respiratory infection, unspecified: Secondary | ICD-10-CM | POA: Diagnosis not present

## 2019-12-03 DIAGNOSIS — Z0000 Encounter for general adult medical examination without abnormal findings: Secondary | ICD-10-CM | POA: Diagnosis not present

## 2019-12-03 DIAGNOSIS — Z1331 Encounter for screening for depression: Secondary | ICD-10-CM | POA: Diagnosis not present

## 2019-12-03 DIAGNOSIS — Z6836 Body mass index (BMI) 36.0-36.9, adult: Secondary | ICD-10-CM | POA: Diagnosis not present

## 2019-12-06 DIAGNOSIS — Z0000 Encounter for general adult medical examination without abnormal findings: Secondary | ICD-10-CM | POA: Diagnosis not present

## 2019-12-06 DIAGNOSIS — Z13220 Encounter for screening for lipoid disorders: Secondary | ICD-10-CM | POA: Diagnosis not present

## 2019-12-06 DIAGNOSIS — Z131 Encounter for screening for diabetes mellitus: Secondary | ICD-10-CM | POA: Diagnosis not present

## 2019-12-16 DIAGNOSIS — Z6836 Body mass index (BMI) 36.0-36.9, adult: Secondary | ICD-10-CM | POA: Diagnosis not present

## 2019-12-16 DIAGNOSIS — R030 Elevated blood-pressure reading, without diagnosis of hypertension: Secondary | ICD-10-CM | POA: Diagnosis not present

## 2019-12-16 DIAGNOSIS — G43909 Migraine, unspecified, not intractable, without status migrainosus: Secondary | ICD-10-CM | POA: Diagnosis not present

## 2020-01-27 DIAGNOSIS — J069 Acute upper respiratory infection, unspecified: Secondary | ICD-10-CM | POA: Diagnosis not present

## 2020-01-27 DIAGNOSIS — J3489 Other specified disorders of nose and nasal sinuses: Secondary | ICD-10-CM | POA: Diagnosis not present

## 2020-01-27 DIAGNOSIS — R519 Headache, unspecified: Secondary | ICD-10-CM | POA: Diagnosis not present

## 2020-01-27 DIAGNOSIS — Z20828 Contact with and (suspected) exposure to other viral communicable diseases: Secondary | ICD-10-CM | POA: Diagnosis not present

## 2020-11-03 DIAGNOSIS — Z3009 Encounter for other general counseling and advice on contraception: Secondary | ICD-10-CM | POA: Diagnosis not present

## 2020-12-18 DIAGNOSIS — J40 Bronchitis, not specified as acute or chronic: Secondary | ICD-10-CM | POA: Diagnosis not present

## 2020-12-18 DIAGNOSIS — J329 Chronic sinusitis, unspecified: Secondary | ICD-10-CM | POA: Diagnosis not present

## 2020-12-18 DIAGNOSIS — J45909 Unspecified asthma, uncomplicated: Secondary | ICD-10-CM | POA: Diagnosis not present

## 2020-12-18 DIAGNOSIS — Z20828 Contact with and (suspected) exposure to other viral communicable diseases: Secondary | ICD-10-CM | POA: Diagnosis not present

## 2020-12-24 DIAGNOSIS — Z302 Encounter for sterilization: Secondary | ICD-10-CM | POA: Diagnosis not present

## 2020-12-30 DIAGNOSIS — Z20828 Contact with and (suspected) exposure to other viral communicable diseases: Secondary | ICD-10-CM | POA: Diagnosis not present

## 2020-12-30 DIAGNOSIS — R0982 Postnasal drip: Secondary | ICD-10-CM | POA: Diagnosis not present

## 2021-04-22 DIAGNOSIS — Z6837 Body mass index (BMI) 37.0-37.9, adult: Secondary | ICD-10-CM | POA: Diagnosis not present

## 2021-04-22 DIAGNOSIS — G510 Bell's palsy: Secondary | ICD-10-CM | POA: Diagnosis not present

## 2021-04-22 DIAGNOSIS — R29810 Facial weakness: Secondary | ICD-10-CM | POA: Diagnosis not present

## 2021-04-22 DIAGNOSIS — R531 Weakness: Secondary | ICD-10-CM | POA: Diagnosis not present

## 2021-04-22 DIAGNOSIS — F17210 Nicotine dependence, cigarettes, uncomplicated: Secondary | ICD-10-CM | POA: Diagnosis not present

## 2021-04-22 DIAGNOSIS — R200 Anesthesia of skin: Secondary | ICD-10-CM | POA: Diagnosis not present

## 2021-05-04 DIAGNOSIS — R1310 Dysphagia, unspecified: Secondary | ICD-10-CM | POA: Diagnosis not present

## 2021-05-04 DIAGNOSIS — Z6837 Body mass index (BMI) 37.0-37.9, adult: Secondary | ICD-10-CM | POA: Diagnosis not present

## 2021-05-04 DIAGNOSIS — G510 Bell's palsy: Secondary | ICD-10-CM | POA: Diagnosis not present

## 2021-05-12 DIAGNOSIS — K649 Unspecified hemorrhoids: Secondary | ICD-10-CM | POA: Diagnosis not present

## 2021-05-12 DIAGNOSIS — K760 Fatty (change of) liver, not elsewhere classified: Secondary | ICD-10-CM | POA: Diagnosis not present

## 2021-05-12 DIAGNOSIS — R1310 Dysphagia, unspecified: Secondary | ICD-10-CM | POA: Diagnosis not present

## 2021-05-12 DIAGNOSIS — K219 Gastro-esophageal reflux disease without esophagitis: Secondary | ICD-10-CM | POA: Diagnosis not present

## 2021-06-02 DIAGNOSIS — K2100 Gastro-esophageal reflux disease with esophagitis, without bleeding: Secondary | ICD-10-CM | POA: Diagnosis not present

## 2021-06-02 DIAGNOSIS — K219 Gastro-esophageal reflux disease without esophagitis: Secondary | ICD-10-CM | POA: Diagnosis not present

## 2021-06-02 DIAGNOSIS — K222 Esophageal obstruction: Secondary | ICD-10-CM | POA: Diagnosis not present

## 2021-06-02 DIAGNOSIS — K449 Diaphragmatic hernia without obstruction or gangrene: Secondary | ICD-10-CM | POA: Diagnosis not present

## 2021-06-02 DIAGNOSIS — R1310 Dysphagia, unspecified: Secondary | ICD-10-CM | POA: Diagnosis not present

## 2021-07-19 DIAGNOSIS — R1310 Dysphagia, unspecified: Secondary | ICD-10-CM | POA: Diagnosis not present

## 2021-07-19 DIAGNOSIS — K219 Gastro-esophageal reflux disease without esophagitis: Secondary | ICD-10-CM | POA: Diagnosis not present

## 2021-07-23 DIAGNOSIS — M791 Myalgia, unspecified site: Secondary | ICD-10-CM | POA: Diagnosis not present

## 2021-07-23 DIAGNOSIS — R109 Unspecified abdominal pain: Secondary | ICD-10-CM | POA: Diagnosis not present

## 2021-07-23 DIAGNOSIS — R509 Fever, unspecified: Secondary | ICD-10-CM | POA: Diagnosis not present

## 2021-07-23 DIAGNOSIS — N3001 Acute cystitis with hematuria: Secondary | ICD-10-CM | POA: Diagnosis not present

## 2021-11-29 DIAGNOSIS — Z20822 Contact with and (suspected) exposure to covid-19: Secondary | ICD-10-CM | POA: Diagnosis not present

## 2021-11-29 DIAGNOSIS — J45909 Unspecified asthma, uncomplicated: Secondary | ICD-10-CM | POA: Diagnosis not present

## 2021-11-29 DIAGNOSIS — J309 Allergic rhinitis, unspecified: Secondary | ICD-10-CM | POA: Diagnosis not present

## 2021-12-05 DIAGNOSIS — J069 Acute upper respiratory infection, unspecified: Secondary | ICD-10-CM | POA: Diagnosis not present

## 2021-12-05 DIAGNOSIS — J209 Acute bronchitis, unspecified: Secondary | ICD-10-CM | POA: Diagnosis not present

## 2021-12-05 DIAGNOSIS — R051 Acute cough: Secondary | ICD-10-CM | POA: Diagnosis not present

## 2022-02-15 DIAGNOSIS — E785 Hyperlipidemia, unspecified: Secondary | ICD-10-CM | POA: Diagnosis not present

## 2022-02-15 DIAGNOSIS — R531 Weakness: Secondary | ICD-10-CM | POA: Diagnosis not present

## 2022-05-20 DIAGNOSIS — J45909 Unspecified asthma, uncomplicated: Secondary | ICD-10-CM | POA: Diagnosis not present

## 2022-05-20 DIAGNOSIS — K439 Ventral hernia without obstruction or gangrene: Secondary | ICD-10-CM | POA: Diagnosis not present

## 2022-05-20 DIAGNOSIS — K2960 Other gastritis without bleeding: Secondary | ICD-10-CM | POA: Diagnosis not present

## 2022-06-01 DIAGNOSIS — R1033 Periumbilical pain: Secondary | ICD-10-CM | POA: Diagnosis not present

## 2022-06-01 DIAGNOSIS — Z720 Tobacco use: Secondary | ICD-10-CM | POA: Diagnosis not present

## 2022-06-01 DIAGNOSIS — K429 Umbilical hernia without obstruction or gangrene: Secondary | ICD-10-CM | POA: Diagnosis not present

## 2022-07-22 DIAGNOSIS — L239 Allergic contact dermatitis, unspecified cause: Secondary | ICD-10-CM | POA: Diagnosis not present

## 2022-07-22 DIAGNOSIS — L299 Pruritus, unspecified: Secondary | ICD-10-CM | POA: Diagnosis not present

## 2022-07-22 DIAGNOSIS — T63441A Toxic effect of venom of bees, accidental (unintentional), initial encounter: Secondary | ICD-10-CM | POA: Diagnosis not present

## 2022-08-05 DIAGNOSIS — J019 Acute sinusitis, unspecified: Secondary | ICD-10-CM | POA: Diagnosis not present

## 2022-11-11 DIAGNOSIS — J029 Acute pharyngitis, unspecified: Secondary | ICD-10-CM | POA: Diagnosis not present

## 2022-11-11 DIAGNOSIS — J069 Acute upper respiratory infection, unspecified: Secondary | ICD-10-CM | POA: Diagnosis not present

## 2022-11-11 DIAGNOSIS — J00 Acute nasopharyngitis [common cold]: Secondary | ICD-10-CM | POA: Diagnosis not present

## 2022-11-11 DIAGNOSIS — J019 Acute sinusitis, unspecified: Secondary | ICD-10-CM | POA: Diagnosis not present

## 2023-02-10 DIAGNOSIS — J069 Acute upper respiratory infection, unspecified: Secondary | ICD-10-CM | POA: Diagnosis not present

## 2023-02-10 DIAGNOSIS — R0981 Nasal congestion: Secondary | ICD-10-CM | POA: Diagnosis not present

## 2023-04-02 DIAGNOSIS — Z79899 Other long term (current) drug therapy: Secondary | ICD-10-CM | POA: Diagnosis not present

## 2023-04-02 DIAGNOSIS — K760 Fatty (change of) liver, not elsewhere classified: Secondary | ICD-10-CM | POA: Diagnosis not present

## 2023-04-02 DIAGNOSIS — R350 Frequency of micturition: Secondary | ICD-10-CM | POA: Diagnosis not present

## 2023-04-02 DIAGNOSIS — K219 Gastro-esophageal reflux disease without esophagitis: Secondary | ICD-10-CM | POA: Diagnosis not present

## 2023-04-02 DIAGNOSIS — Z20822 Contact with and (suspected) exposure to covid-19: Secondary | ICD-10-CM | POA: Diagnosis not present

## 2023-04-02 DIAGNOSIS — J029 Acute pharyngitis, unspecified: Secondary | ICD-10-CM | POA: Diagnosis not present

## 2023-04-02 DIAGNOSIS — R6883 Chills (without fever): Secondary | ICD-10-CM | POA: Diagnosis not present

## 2023-04-02 DIAGNOSIS — F17210 Nicotine dependence, cigarettes, uncomplicated: Secondary | ICD-10-CM | POA: Diagnosis not present

## 2023-04-02 DIAGNOSIS — R000 Tachycardia, unspecified: Secondary | ICD-10-CM | POA: Diagnosis not present

## 2023-04-02 DIAGNOSIS — J020 Streptococcal pharyngitis: Secondary | ICD-10-CM | POA: Diagnosis not present

## 2023-04-02 DIAGNOSIS — Z880 Allergy status to penicillin: Secondary | ICD-10-CM | POA: Diagnosis not present

## 2023-04-10 DIAGNOSIS — J45909 Unspecified asthma, uncomplicated: Secondary | ICD-10-CM | POA: Diagnosis not present

## 2023-04-10 DIAGNOSIS — J020 Streptococcal pharyngitis: Secondary | ICD-10-CM | POA: Diagnosis not present

## 2023-04-10 DIAGNOSIS — F17290 Nicotine dependence, other tobacco product, uncomplicated: Secondary | ICD-10-CM | POA: Diagnosis not present

## 2023-04-10 DIAGNOSIS — F17210 Nicotine dependence, cigarettes, uncomplicated: Secondary | ICD-10-CM | POA: Diagnosis not present

## 2023-04-10 DIAGNOSIS — R918 Other nonspecific abnormal finding of lung field: Secondary | ICD-10-CM | POA: Diagnosis not present

## 2023-04-14 DIAGNOSIS — Z125 Encounter for screening for malignant neoplasm of prostate: Secondary | ICD-10-CM | POA: Diagnosis not present

## 2023-04-14 DIAGNOSIS — E291 Testicular hypofunction: Secondary | ICD-10-CM | POA: Diagnosis not present

## 2023-06-09 DIAGNOSIS — E291 Testicular hypofunction: Secondary | ICD-10-CM | POA: Diagnosis not present

## 2023-06-16 DIAGNOSIS — E291 Testicular hypofunction: Secondary | ICD-10-CM | POA: Diagnosis not present

## 2023-07-20 DIAGNOSIS — Z6838 Body mass index (BMI) 38.0-38.9, adult: Secondary | ICD-10-CM | POA: Diagnosis not present

## 2023-07-20 DIAGNOSIS — S46911A Strain of unspecified muscle, fascia and tendon at shoulder and upper arm level, right arm, initial encounter: Secondary | ICD-10-CM | POA: Diagnosis not present

## 2023-07-20 DIAGNOSIS — S46011A Strain of muscle(s) and tendon(s) of the rotator cuff of right shoulder, initial encounter: Secondary | ICD-10-CM | POA: Diagnosis not present

## 2023-09-08 DIAGNOSIS — Z1331 Encounter for screening for depression: Secondary | ICD-10-CM | POA: Diagnosis not present

## 2023-09-08 DIAGNOSIS — Z1339 Encounter for screening examination for other mental health and behavioral disorders: Secondary | ICD-10-CM | POA: Diagnosis not present

## 2023-09-08 DIAGNOSIS — Z6839 Body mass index (BMI) 39.0-39.9, adult: Secondary | ICD-10-CM | POA: Diagnosis not present

## 2023-09-08 DIAGNOSIS — E291 Testicular hypofunction: Secondary | ICD-10-CM | POA: Diagnosis not present

## 2023-11-24 DIAGNOSIS — E291 Testicular hypofunction: Secondary | ICD-10-CM | POA: Diagnosis not present

## 2023-12-01 DIAGNOSIS — F17210 Nicotine dependence, cigarettes, uncomplicated: Secondary | ICD-10-CM | POA: Diagnosis not present

## 2023-12-01 DIAGNOSIS — Z0000 Encounter for general adult medical examination without abnormal findings: Secondary | ICD-10-CM | POA: Diagnosis not present

## 2023-12-01 DIAGNOSIS — Z125 Encounter for screening for malignant neoplasm of prostate: Secondary | ICD-10-CM | POA: Diagnosis not present

## 2023-12-01 DIAGNOSIS — Z6841 Body Mass Index (BMI) 40.0 and over, adult: Secondary | ICD-10-CM | POA: Diagnosis not present

## 2023-12-01 DIAGNOSIS — Z13220 Encounter for screening for lipoid disorders: Secondary | ICD-10-CM | POA: Diagnosis not present

## 2023-12-01 DIAGNOSIS — Z131 Encounter for screening for diabetes mellitus: Secondary | ICD-10-CM | POA: Diagnosis not present

## 2023-12-01 DIAGNOSIS — F17290 Nicotine dependence, other tobacco product, uncomplicated: Secondary | ICD-10-CM | POA: Diagnosis not present

## 2023-12-06 DIAGNOSIS — R0602 Shortness of breath: Secondary | ICD-10-CM | POA: Diagnosis not present

## 2023-12-06 DIAGNOSIS — G4733 Obstructive sleep apnea (adult) (pediatric): Secondary | ICD-10-CM | POA: Diagnosis not present

## 2023-12-09 DIAGNOSIS — R0602 Shortness of breath: Secondary | ICD-10-CM | POA: Diagnosis not present

## 2023-12-09 DIAGNOSIS — G4733 Obstructive sleep apnea (adult) (pediatric): Secondary | ICD-10-CM | POA: Diagnosis not present

## 2023-12-21 DIAGNOSIS — G4733 Obstructive sleep apnea (adult) (pediatric): Secondary | ICD-10-CM | POA: Diagnosis not present
